# Patient Record
Sex: Male | Born: 1995 | Race: Black or African American | Hispanic: No | Marital: Single | Smoking: Current every day smoker
Health system: Southern US, Community
[De-identification: ages and names within clinical notes are randomized; demographics above are authoritative.]

---
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
## 2018-03-31 ENCOUNTER — Emergency Department (HOSPITAL_COMMUNITY): Payer: Self-pay

## 2018-03-31 ENCOUNTER — Emergency Department (HOSPITAL_COMMUNITY)
Admission: EM | Admit: 2018-03-31 | Discharge: 2018-03-31 | Disposition: A | Discharge status: Home / Self Care | Payer: Self-pay | Attending: Emergency Medicine | Admitting: Emergency Medicine

## 2018-03-31 ENCOUNTER — Encounter (HOSPITAL_COMMUNITY): Payer: Self-pay

## 2018-03-31 DIAGNOSIS — F17210 Nicotine dependence, cigarettes, uncomplicated: Secondary | ICD-10-CM | POA: Insufficient documentation

## 2018-03-31 DIAGNOSIS — J209 Acute bronchitis, unspecified: Secondary | ICD-10-CM | POA: Insufficient documentation

## 2018-03-31 MED ORDER — DEXAMETHASONE 4 MG PO TABS: 4.0000 mg | ORAL_TABLET | Freq: Two times a day (BID) | ORAL | 0 refills | Status: AC

## 2018-03-31 MED ORDER — ALBUTEROL SULFATE HFA 108 (90 BASE) MCG/ACT IN AERS
2.0000 | INHALATION_SPRAY | Freq: Once | RESPIRATORY_TRACT | Status: AC
  Administered 2018-03-31: 2 via RESPIRATORY_TRACT
  Filled 2018-03-31: qty 6.7

## 2018-03-31 MED ORDER — AZITHROMYCIN 250 MG PO TABS
500.0000 mg | ORAL_TABLET | Freq: Once | ORAL | Status: AC
  Administered 2018-03-31: 500 mg via ORAL
  Filled 2018-03-31: qty 2

## 2018-03-31 MED ORDER — PREDNISONE 20 MG PO TABS
40.0000 mg | ORAL_TABLET | Freq: Once | ORAL | Status: AC
Start: 2018-03-31 — End: 2018-03-31
  Administered 2018-03-31: 40 mg via ORAL
  Filled 2018-03-31: qty 2

## 2018-03-31 MED ORDER — IBUPROFEN 800 MG PO TABS
800.0000 mg | ORAL_TABLET | Freq: Once | ORAL | Status: AC
  Administered 2018-03-31: 800 mg via ORAL
  Filled 2018-03-31: qty 1

## 2018-03-31 MED ORDER — PROMETHAZINE-DM 6.25-15 MG/5ML PO SYRP
5.0000 mL | ORAL_SOLUTION | Freq: Four times a day (QID) | ORAL | 0 refills | Status: AC | PRN
Start: 2018-03-31 — End: ?

## 2018-03-31 MED ORDER — ONDANSETRON HCL 4 MG PO TABS
4.0000 mg | ORAL_TABLET | Freq: Once | ORAL | Status: AC
  Administered 2018-03-31: 4 mg via ORAL
  Filled 2018-03-31: qty 1

## 2018-03-31 NOTE — Discharge Instructions (Addendum)
vital signs within normal limits.  Chest x-ray is negative for pneumonia, but does suggest bronchitis.  Your examination suggest bronchitis and an upper respiratory infection.  Please use the decongesting medication of your choice, including Afrin, Claritin-D, Allegra-D, etc.  Please increase fluids.  Please use 2 puffs of albuterol every 4 hours.  Use Decadron 2 times daily with food.  Use Promethazine DM every 6 hours or 4 times a day for cough and congestion.  This medication may cause drowsiness, please use it with caution.  Please wash hands frequently.

## 2018-03-31 NOTE — ED Provider Notes (Signed)
[facility] EMERGENCY DEPARTMENT Provider Note   CSN: [id number] Arrival date & time: 03/31/18  1126     History   Chief Complaint Chief Complaint  Patient presents with  . Cough    HPI Aaron Norris is a 22 y.o. male.  The history is provided by the patient.  Cough  This is a new problem. The current episode started more than 1 week ago. The problem occurs hourly. The problem has been gradually worsening. The cough is productive of sputum. There has been no fever. Associated symptoms include headaches, rhinorrhea, sore throat, shortness of breath and wheezing. Pertinent negatives include no chest pain, no chills and no myalgias. Treatments tried: sore throat spray and cough medication. He is a smoker. His past medical history is significant for bronchitis. His past medical history does not include emphysema or asthma.    History reviewed. No pertinent past medical history.  There are no active problems to display for this patient.   History reviewed. No pertinent surgical history.      Home Medications    Prior to Admission medications   Not on File    Family History No family history on file.  Social History Social History   Tobacco Use  . Smoking status: Current Every Day Smoker    Packs/day: 1.00    Types: Cigarettes  Substance Use Topics  . Alcohol use: Yes    Comment: occassional  . Drug use: Not Currently     Allergies   Patient has no known allergies.   Review of Systems Review of Systems  Constitutional: Negative for activity change and chills.       All ROS Neg except as noted in HPI  HENT: Positive for congestion, rhinorrhea and sore throat. Negative for nosebleeds.   Eyes: Negative for photophobia and discharge.  Respiratory: Positive for cough, shortness of breath and wheezing.   Cardiovascular: Negative for chest pain and palpitations.  Gastrointestinal: Negative for abdominal pain and blood in stool.  Genitourinary: Negative for  dysuria, frequency and hematuria.  Musculoskeletal: Negative for arthralgias, back pain, myalgias and neck pain.  Skin: Negative.   Neurological: Positive for headaches. Negative for dizziness, seizures and speech difficulty.  Psychiatric/Behavioral: Negative for confusion and hallucinations.     Physical Exam Updated Vital Signs BP 135/81 (BP Location: Right Arm)   Pulse 71   Temp 98.5 F (36.9 C) (Oral)   Resp 16   Ht 6' (1.829 m)   Wt 95.3 kg (210 lb)   SpO2 100%   BMI 28.48 kg/m   Physical Exam  Constitutional: He is oriented to person, place, and time. He appears well-developed and well-nourished.  Non-toxic appearance.  HENT:  Head: Normocephalic.  Right Ear: Tympanic membrane and external ear normal.  Left Ear: Tympanic membrane and external ear normal.  Nasal congestion present.  Eyes: Pupils are equal, round, and reactive to light. EOM and lids are normal.  Neck: Normal range of motion. Neck supple. Carotid bruit is not present.  Cardiovascular: Normal rate, regular rhythm, normal heart sounds, intact distal pulses and normal pulses.  Pulmonary/Chest: No respiratory distress. He has wheezes. He has rhonchi.  Abdominal: Soft. Bowel sounds are normal. There is no tenderness. There is no guarding.  Musculoskeletal: Normal range of motion.  Lymphadenopathy:       Head (right side): No submandibular adenopathy present.       Head (left side): No submandibular adenopathy present.    He has no cervical adenopathy.  Neurological: He  is alert and oriented to person, place, and time. He has normal strength. No cranial nerve deficit or sensory deficit.  Skin: Skin is warm and dry.  Psychiatric: He has a normal mood and affect. His speech is normal.  Nursing note and vitals reviewed.    ED Treatments / Results  Labs (all labs ordered are listed, but only abnormal results are displayed) Labs Reviewed - No data to display  EKG None  Radiology Dg Chest 2 View  Result  Date: 03/31/2018 CLINICAL DATA:  Cough and shortness of breath EXAM: CHEST - 2 VIEW COMPARISON:  December 16, 2013 FINDINGS: There is no edema or consolidation. The heart size and pulmonary vascularity are normal. No adenopathy. No bone lesions. IMPRESSION: No edema or consolidation. Electronically Signed   By: Bretta Bang III M.D.   On: 03/31/2018 12:58    Procedures Procedures (including critical care time)  Medications Ordered in ED Medications - No data to display   Initial Impression / Assessment and Plan / ED Course  I have reviewed the triage vital signs and the nursing notes.  Pertinent labs & imaging results that were available during my care of the patient were reviewed by me and considered in my medical decision making (see chart for details).       Final Clinical Impressions(s) / ED Diagnoses MDM  Vital signs within normal limits.  Pulse oximetry is 100% on room air.  Within normal limits by my interpretation.  Chest x-ray shows no edema no consolidation.  The examination favors acute bronchitis and upper respiratory infection.  Patient has been provided with an inhaler.  Prescription for Decadron and promethazine DM given to the patient.   Final diagnoses:  Acute bronchitis, unspecified organism    ED Discharge Orders        Ordered    dexamethasone (DECADRON) 4 MG tablet  2 times daily with meals     03/31/18 1647    promethazine-dextromethorphan (PROMETHAZINE-DM) 6.25-15 MG/5ML syrup  4 times daily PRN     03/31/18 1647       Ivery Quale, PA-C 03/31/18 1657    Samuel Jester, DO 04/04/18 [phone]

## 2018-03-31 NOTE — ED Triage Notes (Signed)
Pt reports cough for one week. Cough is productive and also complains of runny nose and HA

## 2018-10-22 IMAGING — DX DG CHEST 2V
2 series · 2 of 2 positions shown · non-contrast
Comparison: December 16, 2013

CLINICAL DATA: Cough and shortness of breath

EXAM:
CHEST - 2 VIEW

[chest pa]
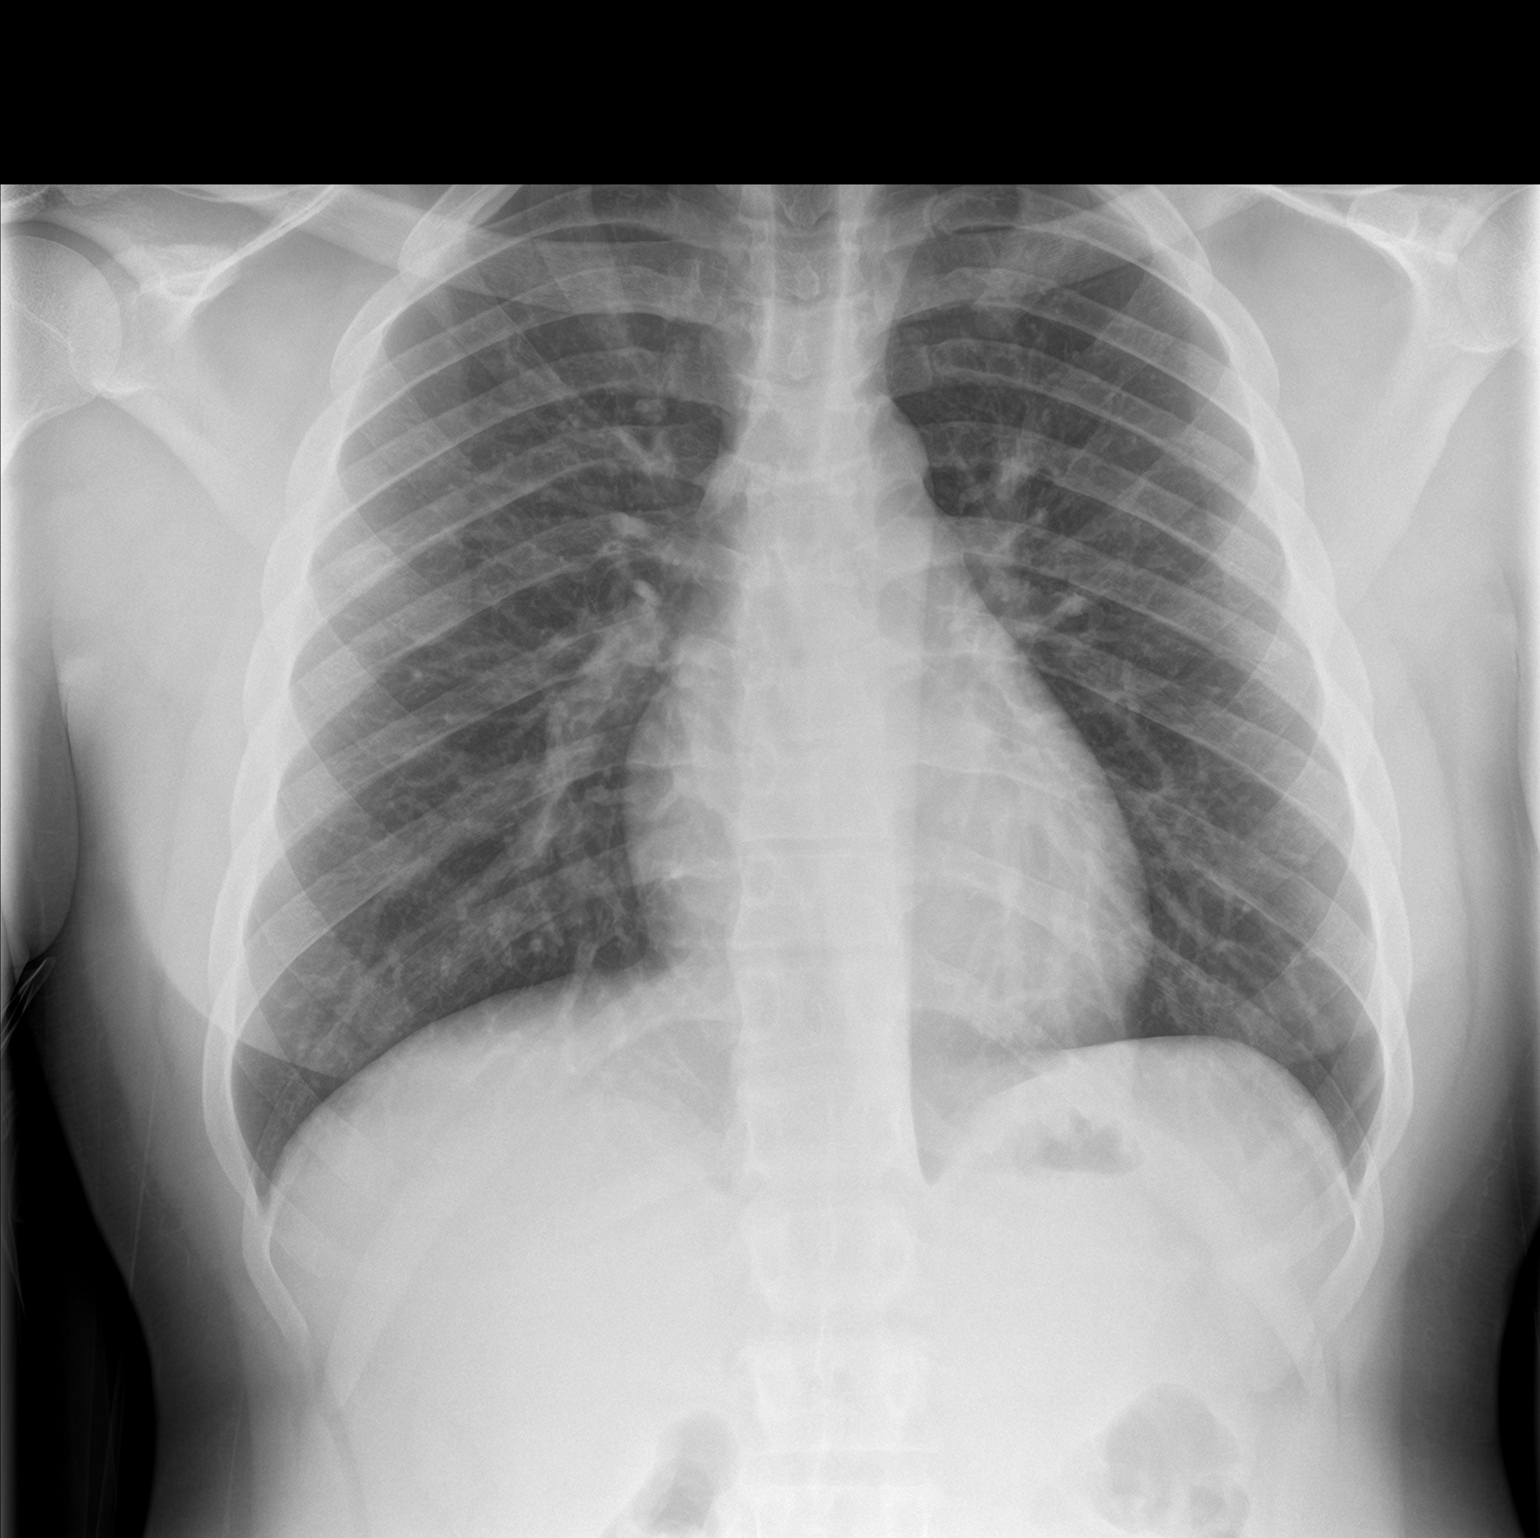

[chest lat]
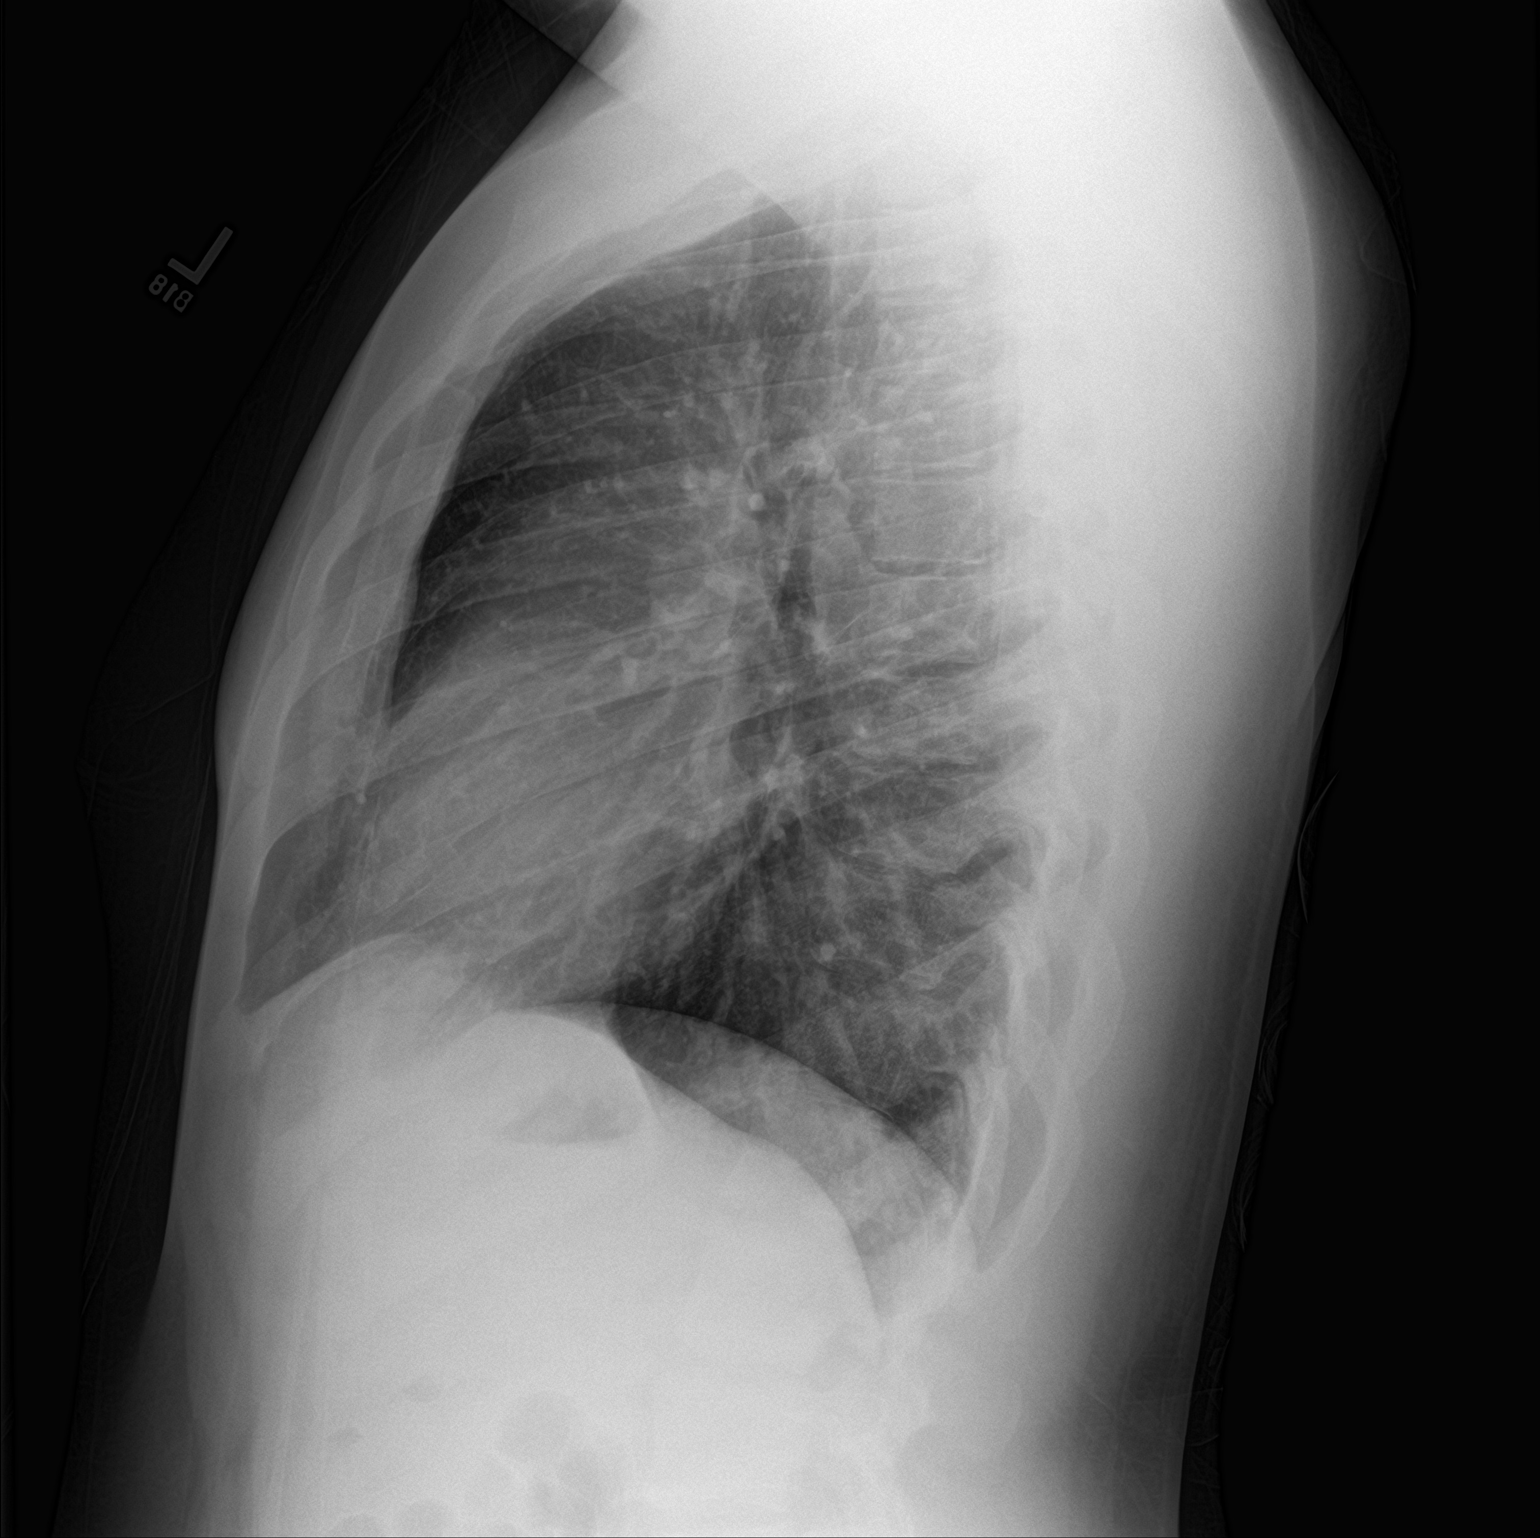

[2 of 2 positions shown; findings below may reference images not displayed]

FINDINGS: There is no edema or consolidation. The heart size and pulmonary
vascularity are normal. No adenopathy. No bone lesions.
IMPRESSION: No edema or consolidation.

## 2021-05-15 ENCOUNTER — Encounter (HOSPITAL_COMMUNITY): Payer: Self-pay | Admitting: Emergency Medicine

## 2021-05-15 ENCOUNTER — Emergency Department (HOSPITAL_COMMUNITY)
Admission: EM | Admit: 2021-05-15 | Discharge: 2021-05-15 | Disposition: A | Discharge status: Home / Self Care | Payer: Self-pay | Attending: Emergency Medicine | Admitting: Emergency Medicine

## 2021-05-15 ENCOUNTER — Other Ambulatory Visit: Payer: Self-pay

## 2021-05-15 DIAGNOSIS — R590 Localized enlarged lymph nodes: Secondary | ICD-10-CM | POA: Insufficient documentation

## 2021-05-15 DIAGNOSIS — F17210 Nicotine dependence, cigarettes, uncomplicated: Secondary | ICD-10-CM | POA: Insufficient documentation

## 2021-05-15 DIAGNOSIS — Z28310 Unvaccinated for covid-19: Secondary | ICD-10-CM | POA: Insufficient documentation

## 2021-05-15 DIAGNOSIS — U071 COVID-19: Secondary | ICD-10-CM | POA: Insufficient documentation

## 2021-05-15 LAB — GROUP A STREP BY PCR: Group A Strep by PCR: NOT DETECTED

## 2021-05-15 LAB — RESP PANEL BY RT-PCR (FLU A&B, COVID) ARPGX2
Influenza A by PCR: NEGATIVE
Influenza B by PCR: NEGATIVE
SARS Coronavirus 2 by RT PCR: POSITIVE — AB

## 2021-05-15 NOTE — Discharge Instructions (Addendum)
You were seen today for chills and sore throat.  Your COVID testing did come back positive.  You need to quarantine for 10 days because you are not vaccinated.  You are otherwise low risk and do not meet criteria for any of the monoclonal antibody therapy.  If you develop any new or worsening symptoms, you should be reevaluated.  Take Tylenol or Motrin for any body aches or pains or fever.  Make sure to stay hydrated.

## 2021-05-15 NOTE — ED Triage Notes (Signed)
Pt c/o fever, chills and sore throat for 2 days.

## 2021-05-15 NOTE — ED Provider Notes (Signed)
[facility] EMERGENCY DEPARTMENT Provider Note   CSN: [id number] Arrival date & time: 05/15/21  0114     History Chief Complaint  Patient presents with   Fever    Aaron Norris is a 25 y.o. male.  HPI     This is a 25 year old male who presents with chills, sore throat, and cough.  Patient reports recent sick contacts.  He states that he had a friend who thought she had COVID but really had a bacterial infection.  He is unsure of what the bacterial infection was.  Over the last 2 days he has had increasing sore throat and nonproductive cough.  He has had chills.  He has not taken his temperature at home but his temperature in triage was 100.9.  Denies chest pain, shortness of breath, abdominal pain, nausea, vomiting.  He is not vaccinated against COVID-19.  History reviewed. No pertinent past medical history.  There are no problems to display for this patient.   History reviewed. No pertinent surgical history.     No family history on file.  Social History   Tobacco Use   Smoking status: Every Day    Packs/day: 1.00    Pack years: 0.00    Types: Cigarettes   Smokeless tobacco: Never  Vaping Use   Vaping Use: Every day  Substance Use Topics   Alcohol use: Yes    Comment: occassional   Drug use: Not Currently    Home Medications Prior to Admission medications   Medication Sig Start Date End Date Taking? Authorizing Provider  dexamethasone (DECADRON) 4 MG tablet Take 1 tablet (4 mg total) by mouth 2 (two) times daily with a meal. 03/31/18   Ivery Quale, PA-C  promethazine-dextromethorphan (PROMETHAZINE-DM) 6.25-15 MG/5ML syrup Take 5 mLs by mouth 4 (four) times daily as needed for cough. 03/31/18   Ivery Quale, PA-C    Allergies    Patient has no known allergies.  Review of Systems   Review of Systems  Constitutional:  Positive for chills and fever.  HENT:  Positive for sore throat.   Respiratory:  Positive for cough.   Cardiovascular:  Negative for  chest pain.  Gastrointestinal:  Negative for abdominal pain, nausea and vomiting.  All other systems reviewed and are negative.  Physical Exam Updated Vital Signs BP 122/71   Pulse 98   Temp (!) 100.9 F (38.3 C) (Oral)   Resp 18   SpO2 95%   Physical Exam Vitals and nursing note reviewed.  Constitutional:      Appearance: He is well-developed.  HENT:     Head: Normocephalic and atraumatic.     Nose: Nose normal.     Mouth/Throat:     Mouth: Mucous membranes are moist.     Comments: Posterior oropharynx with erythema, no tonsillar exudate, no tonsillar swelling, uvula midline Eyes:     Pupils: Pupils are equal, round, and reactive to light.  Cardiovascular:     Rate and Rhythm: Normal rate and regular rhythm.     Heart sounds: Normal heart sounds. No murmur heard. Pulmonary:     Effort: Pulmonary effort is normal. No respiratory distress.     Breath sounds: Normal breath sounds. No wheezing.  Abdominal:     Palpations: Abdomen is soft.     Tenderness: There is no abdominal tenderness.  Musculoskeletal:     Cervical back: Neck supple.  Lymphadenopathy:     Cervical: Cervical adenopathy present.  Skin:    General: Skin is warm and  dry.  Neurological:     Mental Status: He is alert and oriented to person, place, and time.  Psychiatric:        Mood and Affect: Mood normal.    ED Results / Procedures / Treatments   Labs (all labs ordered are listed, but only abnormal results are displayed) Labs Reviewed  RESP PANEL BY RT-PCR (FLU A&B, COVID) ARPGX2 - Abnormal; Notable for the following components:      Result Value   SARS Coronavirus 2 by RT PCR POSITIVE (*)    All other components within normal limits  GROUP A STREP BY PCR    EKG None  Radiology No results found.  Procedures Procedures   Medications Ordered in ED Medications - No data to display  ED Course  I have reviewed the triage vital signs and the nursing notes.  Pertinent labs & imaging  results that were available during my care of the patient were reviewed by me and considered in my medical decision making (see chart for details).    MDM Rules/Calculators/A&P                           Patient presents with chills, sore throat.  Known sick contact.  He is overall nontoxic.  Vital signs notable for temperature of 100.9.  He has mild erythema of the posterior pharynx without exudate or asymmetric swelling.  Suspect viral etiology although strep pharyngitis is a consideration.  Additional considerations include COVID-19, influenza.  Patient was swabbed.  Testing notable for positive COVID-19.  He is overall low risk and does not meet emergency use criteria for Paxil visit or monoclonal antibody.  He was given information regarding symptomatic control at home.  He is not vaccinated.  Recommend he quarantine for 10 days.  After history, exam, and medical workup I feel the patient has been appropriately medically screened and is safe for discharge home. Pertinent diagnoses were discussed with the patient. Patient was given return precautions.   Final Clinical Impression(s) / ED Diagnoses Final diagnoses:  COVID-19    Rx / DC Orders ED Discharge Orders     None        Nathanyal Ashmead, Mayer Masker, MD 05/15/21 [phone]

## 2021-06-10 ENCOUNTER — Emergency Department (HOSPITAL_COMMUNITY)
Admission: EM | Admit: 2021-06-10 | Discharge: 2021-06-10 | Disposition: A | Discharge status: Home / Self Care | Payer: Medicaid Other | Attending: Emergency Medicine | Admitting: Emergency Medicine

## 2021-06-10 ENCOUNTER — Other Ambulatory Visit: Payer: Self-pay

## 2021-06-10 ENCOUNTER — Encounter (HOSPITAL_COMMUNITY): Payer: Self-pay

## 2021-06-10 DIAGNOSIS — L239 Allergic contact dermatitis, unspecified cause: Secondary | ICD-10-CM | POA: Insufficient documentation

## 2021-06-10 DIAGNOSIS — F17210 Nicotine dependence, cigarettes, uncomplicated: Secondary | ICD-10-CM | POA: Insufficient documentation

## 2021-06-10 MED ORDER — HYDROXYZINE HCL 25 MG PO TABS: 25.0000 mg | ORAL_TABLET | Freq: Four times a day (QID) | ORAL | 0 refills | Status: AC | PRN

## 2021-06-10 MED ORDER — DEXAMETHASONE SODIUM PHOSPHATE 10 MG/ML IJ SOLN
10.0000 mg | Freq: Once | INTRAMUSCULAR | Status: AC
  Administered 2021-06-10: 10 mg via INTRAMUSCULAR
  Filled 2021-06-10: qty 1

## 2021-06-10 NOTE — ED Triage Notes (Signed)
Pt arrived via POV c/o rash/ bumps on bilateral lower legs. Pt reports the spots are itching and Pt has scratched the heads off most bumps. Pt worried it is not Monkey pox.

## 2021-06-10 NOTE — ED Provider Notes (Signed)
[facility] EMERGENCY DEPARTMENT Provider Note   CSN: [id number] Arrival date & time: 06/10/21  1660     History Chief Complaint  Patient presents with   Rash    Aaron Norris is a 25 y.o. male.  Patient presents to the emergency department for evaluation of rash on his legs.  Patient reports that he has broken out with bumps on his legs that are very itchy.  He has been scratching the areas without any relief.  He has not tried any medications.  Patient reports that he has no history of any allergies to foods, detergents, etc.  No new contacts.      History reviewed. No pertinent past medical history.  There are no problems to display for this patient.   History reviewed. No pertinent surgical history.     History reviewed. No pertinent family history.  Social History   Tobacco Use   Smoking status: Every Day    Packs/day: 1.00    Types: Cigarettes   Smokeless tobacco: Never  Vaping Use   Vaping Use: Every day  Substance Use Topics   Alcohol use: Yes    Comment: occassional   Drug use: Not Currently    Home Medications Prior to Admission medications   Medication Sig Start Date End Date Taking? Authorizing Provider  hydrOXYzine (ATARAX/VISTARIL) 25 MG tablet Take 1 tablet (25 mg total) by mouth every 6 (six) hours as needed for itching. 06/10/21  Yes Tonna Palazzi, Canary Brim, MD  dexamethasone (DECADRON) 4 MG tablet Take 1 tablet (4 mg total) by mouth 2 (two) times daily with a meal. 03/31/18   Ivery Quale, PA-C  promethazine-dextromethorphan (PROMETHAZINE-DM) 6.25-15 MG/5ML syrup Take 5 mLs by mouth 4 (four) times daily as needed for cough. 03/31/18   Ivery Quale, PA-C    Allergies    Patient has no known allergies.  Review of Systems   Review of Systems  HENT: Negative.    Skin:  Positive for rash.  All other systems reviewed and are negative.  Physical Exam Updated Vital Signs BP 130/71 (BP Location: Right Arm)   Pulse 62   Temp 97.7 F (36.5 C)  (Oral)   Resp 18   Ht 6' (1.829 m)   Wt 102.1 kg   SpO2 100%   BMI 30.52 kg/m   Physical Exam Vitals and nursing note reviewed.  Constitutional:      General: He is not in acute distress.    Appearance: Normal appearance. He is well-developed.  HENT:     Head: Normocephalic and atraumatic.     Right Ear: Hearing normal.     Left Ear: Hearing normal.     Nose: Nose normal.  Eyes:     Conjunctiva/sclera: Conjunctivae normal.     Pupils: Pupils are equal, round, and reactive to light.  Cardiovascular:     Rate and Rhythm: Regular rhythm.     Heart sounds: S1 normal and S2 normal. No murmur heard.   No friction rub. No gallop.  Pulmonary:     Effort: Pulmonary effort is normal. No respiratory distress.     Breath sounds: Normal breath sounds.  Chest:     Chest wall: No tenderness.  Abdominal:     General: Bowel sounds are normal.     Palpations: Abdomen is soft.     Tenderness: There is no abdominal tenderness. There is no guarding or rebound. Negative signs include Murphy's sign and McBurney's sign.     Hernia: No hernia is present.  Musculoskeletal:        General: Normal range of motion.     Cervical back: Normal range of motion and neck supple.  Skin:    General: Skin is warm and dry.     Findings: Rash (Scattered papules and vesicles on both lower legs with some excoriation) present.  Neurological:     Mental Status: He is alert and oriented to person, place, and time.     GCS: GCS eye subscore is 4. GCS verbal subscore is 5. GCS motor subscore is 6.     Cranial Nerves: No cranial nerve deficit.     Sensory: No sensory deficit.     Coordination: Coordination normal.  Psychiatric:        Speech: Speech normal.        Behavior: Behavior normal.        Thought Content: Thought content normal.    ED Results / Procedures / Treatments   Labs (all labs ordered are listed, but only abnormal results are displayed) Labs Reviewed - No data to  display  EKG None  Radiology No results found.  Procedures Procedures   Medications Ordered in ED Medications  dexamethasone (DECADRON) injection 10 mg (has no administration in time range)    ED Course  I have reviewed the triage vital signs and the nursing notes.  Pertinent labs & imaging results that were available during my care of the patient were reviewed by me and considered in my medical decision making (see chart for details).    MDM Rules/Calculators/A&P                           Patient presents with a rash on his legs that resembles very mild contact dermatitis.  Does not resemble bacterial infection.  Final Clinical Impression(s) / ED Diagnoses Final diagnoses:  Allergic contact dermatitis, unspecified trigger    Rx / DC Orders ED Discharge Orders          Ordered    hydrOXYzine (ATARAX/VISTARIL) 25 MG tablet  Every 6 hours PRN        06/10/21 0427             Gilda Crease, MD 06/10/21 [phone]

## 2021-10-08 ENCOUNTER — Encounter (HOSPITAL_COMMUNITY): Payer: Self-pay | Admitting: Emergency Medicine

## 2021-10-08 ENCOUNTER — Other Ambulatory Visit: Payer: Self-pay

## 2021-10-08 ENCOUNTER — Emergency Department (HOSPITAL_COMMUNITY)
Admission: EM | Admit: 2021-10-08 | Discharge: 2021-10-09 | Disposition: A | Discharge status: Home / Self Care | Payer: BLUE CROSS/BLUE SHIELD | Attending: Emergency Medicine | Admitting: Emergency Medicine

## 2021-10-08 DIAGNOSIS — J069 Acute upper respiratory infection, unspecified: Secondary | ICD-10-CM | POA: Diagnosis not present

## 2021-10-08 DIAGNOSIS — J3489 Other specified disorders of nose and nasal sinuses: Secondary | ICD-10-CM | POA: Diagnosis not present

## 2021-10-08 DIAGNOSIS — R059 Cough, unspecified: Secondary | ICD-10-CM | POA: Diagnosis present

## 2021-10-08 DIAGNOSIS — Z20822 Contact with and (suspected) exposure to covid-19: Secondary | ICD-10-CM | POA: Diagnosis not present

## 2021-10-08 DIAGNOSIS — F17210 Nicotine dependence, cigarettes, uncomplicated: Secondary | ICD-10-CM | POA: Insufficient documentation

## 2021-10-08 NOTE — ED Triage Notes (Addendum)
Pt with productive cough x 3 days (yellow sputum). Has tried OTC cough medicine without relief.

## 2021-10-09 LAB — RESP PANEL BY RT-PCR (FLU A&B, COVID) ARPGX2
Influenza A by PCR: NEGATIVE
Influenza B by PCR: NEGATIVE
SARS Coronavirus 2 by RT PCR: NEGATIVE

## 2021-10-09 NOTE — ED Provider Notes (Signed)
[facility] EMERGENCY DEPARTMENT Provider Note   CSN: [id number] Arrival date & time: 10/08/21  2340     History Chief Complaint  Patient presents with   Cough    Aaron Norris is a 24 y.o. male.   Cough Cough characteristics:  Productive Sputum characteristics:  Nondescript Severity:  Mild Onset quality:  Gradual Duration:  3 days Timing:  Constant Progression:  Waxing and waning Chronicity:  Recurrent Context: sick contacts   Relieved by:  Cough suppressants Worsened by:  Deep breathing and exposure to cold air Associated symptoms: chills, shortness of breath, sinus congestion and sore throat       History reviewed. No pertinent past medical history.  There are no problems to display for this patient.   History reviewed. No pertinent surgical history.     History reviewed. No pertinent family history.  Social History   Tobacco Use   Smoking status: Every Day    Packs/day: 1.00    Types: Cigarettes   Smokeless tobacco: Never  Vaping Use   Vaping Use: Every day  Substance Use Topics   Alcohol use: Yes    Comment: occassional   Drug use: Not Currently    Home Medications Prior to Admission medications   Medication Sig Start Date End Date Taking? Authorizing Provider  dexamethasone (DECADRON) 4 MG tablet Take 1 tablet (4 mg total) by mouth 2 (two) times daily with a meal. 03/31/18   Ivery Quale, PA-C  hydrOXYzine (ATARAX/VISTARIL) 25 MG tablet Take 1 tablet (25 mg total) by mouth every 6 (six) hours as needed for itching. 06/10/21   Gilda Crease, MD  promethazine-dextromethorphan (PROMETHAZINE-DM) 6.25-15 MG/5ML syrup Take 5 mLs by mouth 4 (four) times daily as needed for cough. 03/31/18   Ivery Quale, PA-C    Allergies    Patient has no known allergies.  Review of Systems   Review of Systems  Constitutional:  Positive for chills.  HENT:  Positive for sore throat.   Respiratory:  Positive for cough and shortness of breath.   All other  systems reviewed and are negative.  Physical Exam Updated Vital Signs BP (!) 144/90 (BP Location: Right Arm)   Pulse 81   Temp 98.1 F (36.7 C) (Oral)   Resp 18   Ht 6' (1.829 m)   Wt 111.1 kg   SpO2 97%   BMI 33.23 kg/m   Physical Exam Vitals and nursing note reviewed.  Constitutional:      Appearance: He is well-developed.  HENT:     Head: Normocephalic and atraumatic.     Nose: Congestion and rhinorrhea present.     Mouth/Throat:     Pharynx: Oropharynx is clear.  Eyes:     Pupils: Pupils are equal, round, and reactive to light.  Cardiovascular:     Rate and Rhythm: Normal rate.  Pulmonary:     Effort: Pulmonary effort is normal. No respiratory distress.  Abdominal:     General: Abdomen is flat. There is no distension.  Musculoskeletal:        General: Normal range of motion.     Cervical back: Normal range of motion.  Skin:    General: Skin is warm and dry.     Coloration: Skin is not jaundiced or pale.  Neurological:     General: No focal deficit present.     Mental Status: He is alert.    ED Results / Procedures / Treatments   Labs (all labs ordered are listed, but only abnormal  results are displayed) Labs Reviewed  RESP PANEL BY RT-PCR (FLU A&B, COVID) ARPGX2    EKG None  Radiology No results found.  Procedures Procedures   Medications Ordered in ED Medications - No data to display  ED Course  I have reviewed the triage vital signs and the nursing notes.  Pertinent labs & imaging results that were available during my care of the patient were reviewed by me and considered in my medical decision making (see chart for details).    MDM Rules/Calculators/A&P                         Influenza vs RSV vs covid vs other respiratory virus. No idnciation for imaging. Cough gets better with robitussin, will continue. Work note provided. Test prior to discharge, he will follow up results on his own.   Final Clinical Impression(s) / ED Diagnoses Final  diagnoses:  Viral URI with cough    Rx / DC Orders ED Discharge Orders     None        Cally Nygard, Barbara Cower, MD 10/09/21 [phone]

## 2021-10-09 NOTE — Discharge Instructions (Signed)
Your covid/influenza results should result in the next few hours. Please use the instructions in this packet to check them.
